# Patient Record
Sex: Male | Born: 1985 | Race: White | Hispanic: No | Smoking: Former smoker
Health system: Southern US, Community
[De-identification: ages and names within clinical notes are randomized; demographics above are authoritative.]

## PROBLEM LIST (undated history)

## (undated) DIAGNOSIS — S069X9A Unspecified intracranial injury with loss of consciousness of unspecified duration, initial encounter: Secondary | ICD-10-CM

## (undated) DIAGNOSIS — S069XAA Unspecified intracranial injury with loss of consciousness status unknown, initial encounter: Secondary | ICD-10-CM

## (undated) HISTORY — DX: Unspecified intracranial injury with loss of consciousness of unspecified duration, initial encounter: S06.9X9A

## (undated) HISTORY — DX: Unspecified intracranial injury with loss of consciousness status unknown, initial encounter: S06.9XAA

---

## 2014-07-28 ENCOUNTER — Ambulatory Visit (INDEPENDENT_AMBULATORY_CARE_PROVIDER_SITE_OTHER): Payer: BLUE CROSS/BLUE SHIELD | Admitting: Urgent Care

## 2014-07-28 VITALS — BP 120/72 | HR 80 | Temp 98.1°F | Resp 18 | Ht 72.0 in | Wt 163.0 lb

## 2014-07-28 DIAGNOSIS — W540XXA Bitten by dog, initial encounter: Principal | ICD-10-CM

## 2014-07-28 DIAGNOSIS — S61452A Open bite of left hand, initial encounter: Secondary | ICD-10-CM

## 2014-07-28 DIAGNOSIS — Z23 Encounter for immunization: Secondary | ICD-10-CM

## 2014-07-28 DIAGNOSIS — M19042 Primary osteoarthritis, left hand: Secondary | ICD-10-CM

## 2014-07-28 DIAGNOSIS — M13142 Monoarthritis, not elsewhere classified, left hand: Secondary | ICD-10-CM

## 2014-07-28 MED ORDER — NAPROXEN SODIUM 550 MG PO TABS: 550.0000 mg | ORAL_TABLET | Freq: Two times a day (BID) | ORAL | Status: DC

## 2014-07-28 MED ORDER — AMOXICILLIN-POT CLAVULANATE 875-125 MG PO TABS: 1.0000 | ORAL_TABLET | Freq: Two times a day (BID) | ORAL | Status: AC

## 2014-07-28 NOTE — Progress Notes (Signed)
    MRN: [id number] DOB: 04/11/1986  Subjective:   Tonna CornerJoshua Arnold is a 29 y.o. male presenting for 3 week history of dog bite to his left hand. Patient states that he has 2 dogs that got into a scuffle, he tried to separate them and suffered a bite to his left hand. The bite was quick, reports dog is vaccinated and has not had any similar/aggressive behavior before or since. Patient states that his wound bled immediately after his bite, states that he applied pressure, kept it clean. He also had swelling of his hand which has improved except for his 2nd finger and surrounding swelling near bite wounds, also admits some numbness of 2nd finger over radial side. Denies fevers, erythema, streaking, pus or drainage, warmth, weakness. Of note, patient is right handed. Denies any other aggravating or relieving factors, no other questions or concerns.  Zachary Arnold currently has no medications in their medication list.  He has No Known Allergies.  Zachary Arnold  has a past medical history of TBI (traumatic brain injury). Also  has no past surgical history on file.  ROS As in subjective.  Objective:   Vitals: BP 120/72 mmHg  Pulse 80  Temp(Src) 98.1 F (36.7 C) (Oral)  Resp 18  Ht 6' (1.829 m)  Wt 163 lb (73.936 kg)  BMI 22.10 kg/m2  SpO2 99%  Physical Exam  Constitutional: He is oriented to person, place, and time and well-developed, well-nourished, and in no distress.  Cardiovascular: Normal rate.   Pulmonary/Chest: Effort normal.  Musculoskeletal:       Left hand: He exhibits decreased range of motion (slightly decreased flexion of 2nd finger), tenderness (mild tenderness), laceration (bite wound as depicted over 2nd MCP joint and mid palm) and swelling (2nd finger). He exhibits no bony tenderness, normal capillary refill and no deformity. Normal sensation noted. Normal strength noted.       Hands: Neurological: He is alert and oriented to person, place, and time.  Skin: Skin is warm and dry. No rash  noted. No erythema.  Psychiatric: Mood and affect normal.   Assessment and Plan :   1. Dog bite, hand, left, initial encounter 2. Inflammation of hand joint, left - Start Augmentin x10 days, advised patient to watch for signs of worsening infection. Will refer to hand specialist if worsening/no resolution of symptoms. Otherwise due to bite occuring 3 weeks ago and current symptoms/physical exam findings, will follow up with him in 10 days.  3. Need for Tdap vaccination - Tdap vaccine greater than or equal to 7yo IM   Wallis BambergMario Tiffiney Sparrow, PA-C [facility] [phone] 07/28/2014 3:43 PM

## 2014-07-28 NOTE — Patient Instructions (Addendum)
- If you develop hand redness, pus, increased swelling and pain, fevers, changes in hand sensation or range of motion, please call and let me know immediately so we can refer you to a hand specialist. - Otherwise, we will see you in 10 days after completion of your antibiotic course.   Animal Bite An animal bite can result in a scratch on the skin, deep open cut, puncture of the skin, crush injury, or tearing away of the skin or a body part. Dogs are responsible for most animal bites. Children are bitten more often than adults. An animal bite can range from very mild to more serious. A small bite from your house pet is no cause for alarm. However, some animal bites can become infected or injure a bone or other tissue. You must seek medical care if:  The skin is broken and bleeding does not slow down or stop after 15 minutes.  The puncture is deep and difficult to clean (such as a cat bite).  Pain, warmth, redness, or pus develops around the wound.  The bite is from a stray animal or rodent. There may be a risk of rabies infection.  The bite is from a snake, raccoon, skunk, fox, coyote, or bat. There may be a risk of rabies infection.  The person bitten has a chronic illness such as diabetes, liver disease, or cancer, or the person takes medicine that lowers the immune system.  There is concern about the location and severity of the bite. It is important to clean and protect an animal bite wound right away to prevent infection. Follow these steps:  Clean the wound with plenty of water and soap.  Apply an antibiotic cream.  Apply gentle pressure over the wound with a clean towel or gauze to slow or stop bleeding.  Elevate the affected area above the heart to help stop any bleeding.  Seek medical care. Getting medical care within 8 hours of the animal bite leads to the best possible outcome. DIAGNOSIS  Your caregiver will most likely:  Take a detailed history of the animal and the bite  injury.  Perform a wound exam.  Take your medical history. Blood tests or X-rays may be performed. Sometimes, infected bite wounds are cultured and sent to a lab to identify the infectious bacteria.  TREATMENT  Medical treatment will depend on the location and type of animal bite as well as the patient's medical history. Treatment may include:  Wound care, such as cleaning and flushing the wound with saline solution, bandaging, and elevating the affected area.  Antibiotics.  Tetanus immunization.  Rabies immunization.  Leaving the wound open to heal. This is often done with animal bites, due to the high risk of infection. However, in certain cases, wound closure with stitches, wound adhesive, skin adhesive strips, or staples may be used. Infected bites that are left untreated may require intravenous (IV) antibiotics and surgical treatment in the hospital. HOME CARE INSTRUCTIONS  Follow your caregiver's instructions for wound care.  Take all medicines as directed.  If your caregiver prescribes antibiotics, take them as directed. Finish them even if you start to feel better.  Follow up with your caregiver for further exams or immunizations as directed. You may need a tetanus shot if:  You cannot remember when you had your last tetanus shot.  You have never had a tetanus shot.  The injury broke your skin. If you get a tetanus shot, your arm may swell, get red, and feel warm  to the touch. This is common and not a problem. If you need a tetanus shot and you choose not to have one, there is a rare chance of getting tetanus. Sickness from tetanus can be serious. SEEK MEDICAL CARE IF:  You notice warmth, redness, soreness, swelling, pus discharge, or a bad smell coming from the wound.  You have a red line on the skin coming from the wound.  You have a fever, chills, or a general ill feeling.  You have nausea or vomiting.  You have continued or worsening pain.  You have  trouble moving the injured part.  You have other questions or concerns. MAKE SURE YOU:  Understand these instructions.  Will watch your condition.  Will get help right away if you are not doing well or get worse. Document Released: 03/05/2011 Document Revised: 09/09/2011 Document Reviewed: 03/05/2011 [facility] Patient Information 2015 [place], [place]. This information is not intended to replace advice given to you by your health care provider. Make sure you discuss any questions you have with your health care provider.   Tdap Vaccine (Tetanus, Diphtheria, Pertussis): What You Need to Know 1. Why get vaccinated? Tetanus, diphtheria and pertussis can be very serious diseases, even for adolescents and adults. Tdap vaccine can protect [place] from these diseases. TETANUS (Lockjaw) causes painful muscle tightening and stiffness, usually all over the body.  It can lead to tightening of muscles in the head and neck so you can't open your mouth, swallow, or sometimes even breathe. Tetanus kills about 1 out of 5 people who are infected. DIPHTHERIA can cause a thick coating to form in the back of the throat.  It can lead to breathing problems, paralysis, heart failure, and death. PERTUSSIS (Whooping Cough) causes severe coughing spells, which can cause difficulty breathing, vomiting and disturbed sleep.  It can also lead to weight loss, incontinence, and rib fractures. Up to 2 in 100 adolescents and 5 in 100 adults with pertussis are hospitalized or have complications, which could include pneumonia or death. These diseases are caused by bacteria. Diphtheria and pertussis are spread from person to person through coughing or sneezing. Tetanus enters the body through cuts, scratches, or wounds. Before vaccines, the [place] saw as many as 200,000 cases a year of diphtheria and pertussis, and hundreds of cases of tetanus. Since vaccination began, tetanus and diphtheria have dropped by about 99% and  pertussis by about 80%. 2. Tdap vaccine Tdap vaccine can protect adolescents and adults from tetanus, diphtheria, and pertussis. One dose of Tdap is routinely given at age 44 or 66. People who did not get Tdap at that age should get it as soon as possible. Tdap is especially important for health care professionals and anyone having close contact with a baby younger than 12 months. Pregnant women should get a dose of Tdap during every pregnancy, to protect the newborn from pertussis. Infants are most at risk for severe, life-threatening complications from pertussis. A similar vaccine, called Td, protects from tetanus and diphtheria, but not pertussis. A Td booster should be given every 10 years. Tdap may be given as one of these boosters if you have not already gotten a dose. Tdap may also be given after a severe cut or burn to prevent tetanus infection. Your doctor can give you more information. Tdap may safely be given at the same time as other vaccines. 3. Some people should not get this vaccine  If you ever had a life-threatening allergic reaction after a dose of any  tetanus, diphtheria, or pertussis containing vaccine, OR if you have a severe allergy to any part of this vaccine, you should not get Tdap. Tell your doctor if you have any severe allergies.  If you had a coma, or long or multiple seizures within 7 days after a childhood dose of DTP or DTaP, you should not get Tdap, unless a cause other than the vaccine was found. You can still get Td.  Talk to your doctor if you:  have epilepsy or another nervous system problem,  had severe pain or swelling after any vaccine containing diphtheria, tetanus or pertussis,  ever had Guillain-Barr Syndrome (GBS),  aren't feeling well on the day the shot is scheduled. 4. Risks of a vaccine reaction With any medicine, including vaccines, there is a chance of side effects. These are usually mild and go away on their own, but serious reactions are  also possible. Brief fainting spells can follow a vaccination, leading to injuries from falling. Sitting or lying down for about 15 minutes can help prevent these. Tell your doctor if you feel dizzy or light-headed, or have vision changes or ringing in the ears. Mild problems following Tdap (Did not interfere with activities)  Pain where the shot was given (about 3 in 4 adolescents or 2 in 3 adults)  Redness or swelling where the shot was given (about 1 person in 5)  Mild fever of at least 100.47F (up to about 1 in 25 adolescents or 1 in 100 adults)  Headache (about 3 or 4 people in 10)  Tiredness (about 1 person in 3 or 4)  Nausea, vomiting, diarrhea, stomach ache (up to 1 in 4 adolescents or 1 in 10 adults)  Chills, body aches, sore joints, rash, swollen glands (uncommon) Moderate problems following Tdap (Interfered with activities, but did not require medical attention)  Pain where the shot was given (about 1 in 5 adolescents or 1 in 100 adults)  Redness or swelling where the shot was given (up to about 1 in 16 adolescents or 1 in 25 adults)  Fever over 102F (about 1 in 100 adolescents or 1 in 250 adults)  Headache (about 3 in 20 adolescents or 1 in 10 adults)  Nausea, vomiting, diarrhea, stomach ache (up to 1 or 3 people in 100)  Swelling of the entire arm where the shot was given (up to about 3 in 100). Severe problems following Tdap (Unable to perform usual activities; required medical attention)  Swelling, severe pain, bleeding and redness in the arm where the shot was given (rare). A severe allergic reaction could occur after any vaccine (estimated less than 1 in a million doses). 5. What if there is a serious reaction? What should I look for?  Look for anything that concerns you, such as signs of a severe allergic reaction, very high fever, or behavior changes. Signs of a severe allergic reaction can include hives, swelling of the face and throat, difficulty  breathing, a fast heartbeat, dizziness, and weakness. These would start a few minutes to a few hours after the vaccination. What should I do?  If you think it is a severe allergic reaction or other emergency that can't wait, call 9-1-1 or get the person to the nearest hospital. Otherwise, call your doctor.  Afterward, the reaction should be reported to the "Vaccine Adverse Event Reporting System" (VAERS). Your doctor might file this report, or you can do it yourself through the VAERS web site at www.vaers.LAgents.no, or by calling 1-(510)569-2602. VAERS is only  for reporting reactions. They do not give medical advice.  6. The National Vaccine Injury Compensation Program The [employer] Vaccine Injury Compensation Program (VICP) is a federal program that was created to compensate people who may have been injured by certain vaccines. Persons who believe they may have been injured by a vaccine can learn about the program and about filing a claim by calling 1-4014551516 or visiting the VICP website at SpiritualWord.at. 7. How can I learn more?  Ask your doctor.  Call your local or state health department.  Contact the Centers for Disease Control and Prevention (CDC):  Call 772 053 5457 or visit CDC's website at PicCapture.uy. CDC Tdap Vaccine VIS (11/07/11) Document Released: 12/17/2011 Document Revised: 11/01/2013 Document Reviewed: 09/29/2013 [employer] Patient Information 2015 [place], [place]. This information is not intended to replace advice given to you by your health care provider. Make sure you discuss any questions you have with your health care provider.

## 2014-08-16 ENCOUNTER — Ambulatory Visit (INDEPENDENT_AMBULATORY_CARE_PROVIDER_SITE_OTHER): Payer: BLUE CROSS/BLUE SHIELD | Admitting: Internal Medicine

## 2014-08-16 VITALS — BP 112/64 | HR 76 | Temp 97.7°F | Resp 16 | Ht 72.0 in | Wt 158.0 lb

## 2014-08-16 DIAGNOSIS — J069 Acute upper respiratory infection, unspecified: Secondary | ICD-10-CM

## 2014-08-16 MED ORDER — FLUTICASONE PROPIONATE 50 MCG/ACT NA SUSP: 2.0000 | Freq: Every day | NASAL | Status: DC

## 2014-08-16 NOTE — Patient Instructions (Signed)
It was nice to see you today.    You have a viral URI (see below).  I have prescribed Flonase for your congestion.    Get plenty of rest and drink plenty of fluids.  Take care  Dr. Adriana Simas Upper Respiratory Infection, Adult An upper respiratory infection (URI) is also sometimes known as the common cold. The upper respiratory tract includes the nose, sinuses, throat, trachea, and bronchi. Bronchi are the airways leading to the lungs. Most people improve within 1 week, but symptoms can last up to 2 weeks. A residual cough may last even longer.  CAUSES Many different viruses can infect the tissues lining the upper respiratory tract. The tissues become irritated and inflamed and often become very moist. Mucus production is also common. A cold is contagious. You can easily spread the virus to others by oral contact. This includes kissing, sharing a glass, coughing, or sneezing. Touching your mouth or nose and then touching a surface, which is then touched by another person, can also spread the virus. SYMPTOMS  Symptoms typically develop 1 to 3 days after you come in contact with a cold virus. Symptoms vary from person to person. They may include:  Runny nose.  Sneezing.  Nasal congestion.  Sinus irritation.  Sore throat.  Loss of voice (laryngitis).  Cough.  Fatigue.  Muscle aches.  Loss of appetite.  Headache.  Low-grade fever. DIAGNOSIS  You might diagnose your own cold based on familiar symptoms, since most people get a cold 2 to 3 times a year. Your caregiver can confirm this based on your exam. Most importantly, your caregiver can check that your symptoms are not due to another disease such as strep throat, sinusitis, pneumonia, asthma, or epiglottitis. Blood tests, throat tests, and X-rays are not necessary to diagnose a common cold, but they may sometimes be helpful in excluding other more serious diseases. Your caregiver will decide if any further tests are required. RISKS  AND COMPLICATIONS  You may be at risk for a more severe case of the common cold if you smoke cigarettes, have chronic heart disease (such as heart failure) or lung disease (such as asthma), or if you have a weakened immune system. The very young and very old are also at risk for more serious infections. Bacterial sinusitis, middle ear infections, and bacterial pneumonia can complicate the common cold. The common cold can worsen asthma and chronic obstructive pulmonary disease (COPD). Sometimes, these complications can require emergency medical care and may be life-threatening. PREVENTION  The best way to protect against getting a cold is to practice good hygiene. Avoid oral or hand contact with people with cold symptoms. Wash your hands often if contact occurs. There is no clear evidence that vitamin C, vitamin E, echinacea, or exercise reduces the chance of developing a cold. However, it is always recommended to get plenty of rest and practice good nutrition. TREATMENT  Treatment is directed at relieving symptoms. There is no cure. Antibiotics are not effective, because the infection is caused by a virus, not by bacteria. Treatment may include:  Increased fluid intake. Sports drinks offer valuable electrolytes, sugars, and fluids.  Breathing heated mist or steam (vaporizer or shower).  Eating chicken soup or other clear broths, and maintaining good nutrition.  Getting plenty of rest.  Using gargles or lozenges for comfort.  Controlling fevers with ibuprofen or acetaminophen as directed by your caregiver.  Increasing usage of your inhaler if you have asthma. Zinc gel and zinc lozenges, taken in the  first 24 hours of the common cold, can shorten the duration and lessen the severity of symptoms. Pain medicines may help with fever, muscle aches, and throat pain. A variety of non-prescription medicines are available to treat congestion and runny nose. Your caregiver can make recommendations and may  suggest nasal or lung inhalers for other symptoms.  HOME CARE INSTRUCTIONS   Only take over-the-counter or prescription medicines for pain, discomfort, or fever as directed by your caregiver.  Use a warm mist humidifier or inhale steam from a shower to increase air moisture. This may keep secretions moist and make it easier to breathe.  Drink enough water and fluids to keep your urine clear or pale yellow.  Rest as needed.  Return to work when your temperature has returned to normal or as your caregiver advises. You may need to stay home longer to avoid infecting others. You can also use a face mask and careful hand washing to prevent spread of the virus. SEEK MEDICAL CARE IF:   After the first few days, you feel you are getting worse rather than better.  You need your caregiver's advice about medicines to control symptoms.  You develop chills, worsening shortness of breath, or brown or red sputum. These may be signs of pneumonia.  You develop yellow or brown nasal discharge or pain in the face, especially when you bend forward. These may be signs of sinusitis.  You develop a fever, swollen neck glands, pain with swallowing, or white areas in the back of your throat. These may be signs of strep throat. SEEK IMMEDIATE MEDICAL CARE IF:   You have a fever.  You develop severe or persistent headache, ear pain, sinus pain, or chest pain.  You develop wheezing, a prolonged cough, cough up blood, or have a change in your usual mucus (if you have chronic lung disease).  You develop sore muscles or a stiff neck. Document Released: 12/11/2000 Document Revised: 09/09/2011 Document Reviewed: 09/22/2013 [facility] Patient Information 2015 [place], [place]. This information is not intended to replace advice given to you by your health care provider. Make sure you discuss any questions you have with your health care provider.

## 2014-08-16 NOTE — Progress Notes (Addendum)
   Subjective:    [name], male    DOB: Mar 25, 1986, 29 y.o.   MRN: [id number]  HPI 29 year old male presents today with complaints of nasal congestion & cough.  1) Nasal congestion/cough  Patient reports that he has been experiencing significant nasal congestion, drainage and cough for the past 5 days.  No exacerbating/relieving factors.  No interventions/treatments tried.   No associated sore throat, fever, chills.  No sick contacts.  He does note "popping" of his left ear.   Review of Systems  Constitutional: Negative for fever and appetite change.  HENT: Positive for congestion, postnasal drip, rhinorrhea and sinus pressure. Negative for sore throat.   Respiratory: Negative.   Cardiovascular: Negative.   Gastrointestinal: Negative.   Musculoskeletal: Negative for myalgias.  Skin: Negative.       Objective:   Physical Exam Filed Vitals:   08/16/14 1103  BP: 112/64  Pulse: 76  Temp: 97.7 F (36.5 C)  Resp: 16   Exam: General: well appearing, NAD. HEENT: NCAT.  TM's normal bilaterally. Oropharynx mildly erythematous. No exudates noted.  Mild L maxillary sinus tenderness.  Cardiovascular: RRR. No murmurs, rubs, or gallops. Respiratory: CTAB. No rales, rhonchi, or wheeze. Abdomen: soft, nontender, nondistended. Extremities: No LE edema.    Assessment & Plan:   URI - Symptoms and physical exam consistent with URI. - Advised symptomatic care. - Flonase for congestion.  Everlene OtherJayce Addy Mcmannis DO Family Medicine PGY-3 I have participated in the care of this patient with the Resident MD and agree with Diagnosis and Plan as documented. Robert P. Merla Richesoolittle, M.D.

## 2014-08-19 NOTE — Addendum Note (Signed)
Addended by: Tonye PearsonOLITTLE, Jovaughn Wojtaszek P on: 08/19/2014 01:21 PM   Modules accepted: Level of Service

## 2015-04-04 ENCOUNTER — Ambulatory Visit: Payer: Worker's Compensation

## 2015-04-04 ENCOUNTER — Ambulatory Visit (INDEPENDENT_AMBULATORY_CARE_PROVIDER_SITE_OTHER): Payer: Worker's Compensation | Admitting: Family Medicine

## 2015-04-04 VITALS — BP 130/78 | HR 54 | Temp 98.2°F | Resp 16 | Ht 72.0 in | Wt 179.0 lb

## 2015-04-04 DIAGNOSIS — S60132A Contusion of left middle finger with damage to nail, initial encounter: Secondary | ICD-10-CM

## 2015-04-04 DIAGNOSIS — Y990 Civilian activity done for income or pay: Secondary | ICD-10-CM

## 2015-04-04 NOTE — Patient Instructions (Signed)
Wear the finger protector until it is not extremely tender. Return if problems or concerns.

## 2015-04-04 NOTE — Progress Notes (Signed)
[name], male    DOB: September 30, 1985  Age: 29 y.o. MRN: [id number]  Chief Complaint  Patient presents with  . 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 A dog jumped away from him while he was holding the leash yesterday. A knot on the leash somehow caught the distal portion of the left third finger. It has been painful and swollen a little and developed some erythema. He came in to get it checked to make sure nothing was broken.  Current allergies, medications, problem list, past/family and social histories reviewed.  Objective:  BP 130/78 mmHg  Pulse 54  Temp(Src) 98.2 F (36.8 C)  Resp 16  Ht 6' (1.829 m)  Wt 179 lb (81.194 kg)  BMI 24.27 kg/m2  SpO2 98%  No broken skin. He has a little little band of subungual hematoma under the cuticle inch. There is erythema of the dorsum of the third finger down to the DLP joint. There is bruising on the pad of the finger. Flexion and extension are intact though very painful. The pain is from the distal portion of the middle phalanx and beyond.  [facility] reading (PRIMARY) by  Dr. Alwyn Ren Normal x-ray.    Assessment & Plan:   Assessment: 1. Contusion of left middle finger with damage to nail, initial encounter   2. Work related injury       Plan: Treat symptomatically  Orders Placed This Encounter  Procedures  . DG Finger Middle Left    Order Specific Question:  Reason for Exam (SYMPTOM  OR DIAGNOSIS REQUIRED)    Answer:  injury dip and distal phalynx of 3rd left finger    Order Specific Question:  Preferred imaging location?    Answer:  External    Patient Instructions  Wear the finger protector until it is not extremely tender. Return if problems or concerns.   Return if symptoms worsen or fail to improve.   Susa Bones, MD 04/04/2015

## 2015-08-23 ENCOUNTER — Ambulatory Visit (INDEPENDENT_AMBULATORY_CARE_PROVIDER_SITE_OTHER): Payer: BLUE CROSS/BLUE SHIELD | Admitting: Emergency Medicine

## 2015-08-23 VITALS — BP 114/70 | HR 87 | Temp 98.8°F | Resp 17 | Ht 73.0 in | Wt 170.0 lb

## 2015-08-23 DIAGNOSIS — J101 Influenza due to other identified influenza virus with other respiratory manifestations: Secondary | ICD-10-CM | POA: Diagnosis not present

## 2015-08-23 DIAGNOSIS — J029 Acute pharyngitis, unspecified: Secondary | ICD-10-CM | POA: Diagnosis not present

## 2015-08-23 DIAGNOSIS — R509 Fever, unspecified: Secondary | ICD-10-CM

## 2015-08-23 LAB — POCT INFLUENZA A/B
Influenza A, POC: POSITIVE — AB
Influenza B, POC: NEGATIVE

## 2015-08-23 LAB — POCT RAPID STREP A (OFFICE): Rapid Strep A Screen: NEGATIVE

## 2015-08-23 MED ORDER — OSELTAMIVIR PHOSPHATE 75 MG PO CAPS: 75.0000 mg | ORAL_CAPSULE | Freq: Two times a day (BID) | ORAL | Status: DC

## 2015-08-23 NOTE — Progress Notes (Signed)
By signing my name below, I, Stann Ore, attest that this documentation has been prepared under the direction and in the presence of Lesle Chris, MD. Electronically Signed: Stann Ore, Scribe. 08/23/2015 , 9:21 AM .  Patient was seen in room 7 .  Chief Complaint:  Chief Complaint  Patient presents with  . Chills  . Generalized Body Aches  . Night Sweats  . Sore Throat  . Cough    HPI: Zachary Arnold is a 30 y.o. male who reports to [facility] today complaining of flu-like symptoms that was noticed yesterday.  Pt states that his symptoms started with chills yesterday 12:00PM. Then, he noticed myalgia all over his body mid-afternoon. He felt really cold with chills last night and fell asleep with blankets. He felt hot throughout the night and woke up with sweats this morning. He has a mild headache with cough and congestion. He denies any sore throat. He denies flu shot this year.   He went on a trip to [place] and had a friend that was sick. She's gotten worse since the past weekend.   He works as a Engineer, site.   Past Medical History  Diagnosis Date  . TBI (traumatic brain injury) (HCC)    No past surgical history on file. Social History   Social History  . Marital Status: Legally Separated    Spouse Name: N/A  . Number of Children: N/A  . Years of Education: N/A   Social History Main Topics  . Smoking status: Former Games developer  . Smokeless tobacco: None  . Alcohol Use: 0.6 - 1.2 oz/week    1-2 Standard drinks or equivalent per week  . Drug Use: No  . Sexual Activity: Not Asked   Other Topics Concern  . None   Social History Narrative   Family History  Problem Relation Age of Onset  . Cancer Mother   . Cancer Maternal Grandmother   . Cancer Maternal Grandfather    No Known Allergies Prior to Admission medications   Not on File     ROS:  Constitutional: negative for weight changes, or fatigue; positive for chills, sweats,  fever HEENT: negative for vision changes, hearing loss, rhinorrhea, ST, epistaxis, or sinus pressure; positive for congestion Cardiovascular: negative for chest pain or palpitations Respiratory: negative for hemoptysis, wheezing, shortness of breath; positive for cough Abdominal: negative for abdominal pain, nausea, vomiting, diarrhea, or constipation Dermatological: negative for rash Musc: positive for myalgia (general) Neurologic: negative for dizziness, or syncope; positive for headache All other systems reviewed and are otherwise negative with the exception to those above and in the HPI.  PHYSICAL EXAM: Filed Vitals:   08/23/15 0854  BP: 114/70  Pulse: 87  Temp: 98.8 F (37.1 C)  Resp: 17   Body mass index is 22.43 kg/(m^2).   General: Alert, no acute distress HEENT:  Normocephalic, atraumatic, oropharynx patent; Significant nasal congestion, mild redness in throat Eye: EOMI, PEERLDC Cardiovascular:  Regular rate and rhythm, no rubs murmurs or gallops.  No Carotid bruits, radial pulse intact. No pedal edema.  Respiratory: Clear to auscultation bilaterally.  No wheezes, rales, or rhonchi.  No cyanosis, no use of accessory musculature Abdominal: No organomegaly, abdomen is soft and non-tender, positive bowel sounds. No masses. Musculoskeletal: Gait intact. No edema, tenderness Skin: No rashes. Neurologic: Facial musculature symmetric. Psychiatric: Patient acts appropriately throughout our interaction.  Lymphatic: No cervical or submandibular lymphadenopathy Genitourinary/Anorectal: No acute findings  LABS: Results for orders placed or performed  in visit on 08/23/15  POCT Influenza A/B  Result Value Ref Range   Influenza A, POC Positive (A) Negative   Influenza B, POC Negative Negative  POCT rapid strep A  Result Value Ref Range   Rapid Strep A Screen Negative Negative    EKG/XRAY:   Primary read interpreted by Dr. Cleta Alberts at [facility].   ASSESSMENT/PLAN: Patient with  fluid. Will treat with Tamiflu twice a day 5 days.I personally performed the services described in this documentation, which was scribed in my presence. The recorded information has been reviewed and is accurate.   Gross sideeffects, risk and benefits, and alternatives of medications d/w patient. Patient is aware that all medications have potential sideeffects and we are unable to predict every sideeffect or drug-drug interaction that may occur.  Lesle Chris MD 08/23/2015 9:21 AM

## 2015-08-23 NOTE — Patient Instructions (Signed)

## 2015-10-04 ENCOUNTER — Ambulatory Visit (INDEPENDENT_AMBULATORY_CARE_PROVIDER_SITE_OTHER): Payer: Worker's Compensation | Admitting: Family Medicine

## 2015-10-04 VITALS — BP 122/66 | HR 81 | Temp 97.7°F | Resp 16 | Ht 73.0 in | Wt 174.4 lb

## 2015-10-04 DIAGNOSIS — W5581XA Bitten by other mammals, initial encounter: Secondary | ICD-10-CM | POA: Diagnosis not present

## 2015-10-04 DIAGNOSIS — T148 Other injury of unspecified body region: Secondary | ICD-10-CM | POA: Diagnosis not present

## 2015-10-04 DIAGNOSIS — T148XXA Other injury of unspecified body region, initial encounter: Secondary | ICD-10-CM

## 2015-10-04 MED ORDER — MUPIROCIN 2 % EX OINT: 1.0000 "application " | TOPICAL_OINTMENT | Freq: Two times a day (BID) | CUTANEOUS | Status: DC

## 2015-10-04 NOTE — Progress Notes (Signed)
     HPI  Patient presents today for a dog bite for worker's comp  Patient explains about 830 am he was being introduced to a dog for a client (works for [employer]) when the dog lost control suddenly and bit him on the L abd, L hip, and L hand.   The dog owner is [place] into the [place] The dog is up to date on all immunizations  There are no deep wounds, they are descibed as abrasions, he feels he can go back to work today   PMH: Smoking status noted ROS: Per HPI  Objective: BP 122/66 mmHg  Pulse 81  Temp(Src) 97.7 F (36.5 C) (Oral)  Resp 16  Ht 6\' 1"  (1.854 m)  Wt 174 lb 6.4 oz (79.107 kg)  BMI 23.01 kg/m2  SpO2 98% Gen: NAD, alert, cooperative with exam HEENT: NCAT Neuro: Alert and oriented, No gross deficits Skin: L hand with small red lsesion without any broken skin on fingerweb between thumb and 2nd digit  L abd with abrasion measuring 10.8 cm X 4.5 cm, 4 separate circular abrasions approix 0.5 cm in diameter consistent with K9 teeth marks, no puncture or bleeding present  L anterior hip/inguinal area with 2 circular abrasions approx 0.5 cm in diameter without bleeding or puncture  Assessment and plan:  # Animal bite All abrasions, no deep wounds No deep puncture wounds, no bleeding No warmth or induration of any lesions Cover with mupirocin hear today,  Mupirociun prescribed to use for 5 days No deep wound sto warrant systemic ABx or concern for tetanus    Meds ordered this encounter  Medications  . mupirocin ointment (BACTROBAN) 2 %    Sig: Place 1 application into the nose 2 (two) times daily.    Dispense:  22 g    Refill:  0    Kevin FentonSamuel Hermenia Fritcher, MD 10:36 AM

## 2015-10-04 NOTE — Patient Instructions (Addendum)
Great to meet you!  If you have any issues please come back right away. Apply mupirocin ointment twice daily for 5 days to each wound.   Come back right away if you develop worsening pain, swelling, redness of the wound, fever, or begin to feel ill.

## 2015-11-20 ENCOUNTER — Ambulatory Visit (INDEPENDENT_AMBULATORY_CARE_PROVIDER_SITE_OTHER): Payer: BLUE CROSS/BLUE SHIELD | Admitting: Family Medicine

## 2015-11-20 ENCOUNTER — Ambulatory Visit (INDEPENDENT_AMBULATORY_CARE_PROVIDER_SITE_OTHER): Payer: BLUE CROSS/BLUE SHIELD

## 2015-11-20 VITALS — BP 126/78 | HR 82 | Temp 98.9°F | Resp 17 | Ht 72.5 in | Wt 167.0 lb

## 2015-11-20 DIAGNOSIS — M25561 Pain in right knee: Secondary | ICD-10-CM

## 2015-11-20 NOTE — Progress Notes (Signed)
Subjective:  By signing my name below, I, Zachary Arnold, attest that this documentation has been prepared under the direction and in the presence of Meredith Staggers, MD.  Electronically Signed: Andrew Au, ED Scribe. 11/20/2015. 8:46 AM.  [name], male    DOB: 01-Jun-1986, 30 y.o.   MRN: [id number]  HPI   Chief Complaint  Patient presents with  . Knee Pain    right side    HPI Comments: Zachary Arnold is a 30 y.o. male who presents to the [facility] complaining of a recurrent right knee injury that occurred 3 days ago and 2 days ago . Pt stats while crouched down he twisted right and heard a "pop". He notes swelling to his knee 2-3 days ago that has somewhat improved. He reports worsening soreness with bearing weight to right knee  but denies locking and giving way of right knee. Pt reports first episode of knee pain occurred about a year ago while doing yoga that improved with some rest. Knee pain has become more frequent throughout the year worse the past 3 days. Pt is Veteran and has been to [place] twice and notes some wear and tear to knees but no known knee injury.   Pt works at [employer].   There are no active problems to display for this patient.  Past Medical History  Diagnosis Date  . TBI (traumatic brain injury) (HCC)    No past surgical history on file. No Known Allergies Prior to Admission medications   Not on File   Social History   Social History  . Marital Status: Legally Separated    Spouse Name: N/A  . Number of Children: N/A  . Years of Education: N/A   Occupational History  . Not on file.   Social History Main Topics  . Smoking status: Former Games developer  . Smokeless tobacco: Not on file  . Alcohol Use: 0.6 - 1.2 oz/week    1-2 Standard drinks or equivalent per week  . Drug Use: No  . Sexual Activity: Not on file   Other Topics Concern  . Not on file   Social History Narrative   Review of Systems    Musculoskeletal: Positive for arthralgias and gait problem.  Skin: Negative for color change, rash and wound.  Neurological: Negative for weakness and numbness.   Objective:  Physical Exam  Constitutional: He is oriented to person, place, and time. He appears well-developed and well-nourished. No distress.  HENT:  Head: Normocephalic and atraumatic.  Eyes: Conjunctivae and EOM are normal.  Neck: Neck supple.  Cardiovascular: Normal rate.   Pulmonary/Chest: Effort normal.  Musculoskeletal: Normal range of motion.  Trace effusion on right knee. Skin intact no erythema. No warmth. Slightly tender to lateral joint line. Fibular head non tender. Full ROM but pain at terminal flexion. Negative valgus and varus test. Pain  with McMurray's and external rotation.  On lochman's more motion compared to left but do feel an endpoint.   Neurological: He is alert and oriented to person, place, and time.  Skin: Skin is warm and dry.  Psychiatric: He has a normal mood and affect. His behavior is normal.  Nursing note and vitals reviewed.  Filed Vitals:   11/20/15 0839  BP: 126/78  Pulse: 82  Temp: 98.9 F (37.2 C)  TempSrc: Oral  Resp: 17  Height: 6' 0.5" (1.842 m)  Weight: 167 lb (75.751 kg)  SpO2: 98%    Dg Knee Complete 4  Views Right  11/20/2015  CLINICAL DATA:  Recurrent knee injury.  Swelling. EXAM: RIGHT KNEE - COMPLETE 4+ VIEW COMPARISON:  None. FINDINGS: No acute bony or joint abnormality identified. No evidence of fracture or dislocation . IMPRESSION: No acute or focal abnormality. Electronically Signed   By: Maisie Fus  Register   On: 11/20/2015 09:35    Assessment & Plan:   Zachary Arnold is a 30 y.o. male Knee pain, right - Plan: DG Knee Complete 4 Views Right, Apply knee sleeve  -Episodic pain with slight instability symptoms. No true locking or giving way. Minimal effusion. Differential diagnosis includes Arnold meniscus tear, less likely ligamentous tear.   -Try hinged knee brace,  over-the-counter NSAID as needed, home exercise program for next 1-2 weeks at the most. If not significantly improved by that time, consider MRI or orthopedic eval.    No orders of the defined types were placed in this encounter.   Patient Instructions       IF you received an x-ray today, you will receive an invoice from [facility]. Please contact [facility] at [phone] with questions or concerns regarding your invoice.   IF you received labwork today, you will receive an invoice from [employer]. Please contact [facility] at [phone] with questions or concerns regarding your invoice.   Our billing staff will not be able to assist you with questions regarding bills from these companies.  You will be contacted with the lab results as soon as they are available. The fastest way to get your results is to activate your My Chart account. Instructions are located on the last page of this paperwork. If you have not heard from [place] regarding the results in 2 weeks, please contact this office.     You can try the knee brace for now, see information on exercises below in case this is a meniscus tear. Over-the-counter Advil or Aleve is okay for now. Over the next 1-2 weeks, if not improving, let me know and I will order an MRI or refer you to an orthopedic surgeon. Return sooner if worse or trouble with weightbearing.  Meniscus Tear With Phase I Rehab The meniscus is a C-shaped cartilage structure, located in the knee joint between the thigh bone (femur) and the shinbone (tibia). Two menisci are located in each knee joint: the inner and outer meniscus. The meniscus acts as an adapter between the thigh bone and shinbone, allowing them to fit properly together. It also functions as a shock absorber, to reduce the stress placed on the knee joint and to help supply nutrients to the knee joint cartilage. As people age, the meniscus begins to harden and  become more vulnerable to injury. Meniscus tears are a common injury, especially in older athletes. Inner meniscus tears are more common than outer meniscus tears.  SYMPTOMS   Pain in the knee, especially with standing or squatting with the affected leg.  Tenderness along the joint line.  Swelling in the knee joint (effusion), usually starting 1 to 2 days after injury.  Locking or catching of the knee joint, causing inability to straighten the knee completely.  Giving way or buckling of the knee. CAUSES  A meniscus tear occurs when a force is placed on the meniscus that is greater than it can handle. Common causes of injury include:  Direct hit (trauma) to the knee.  Twisting, pivoting, or cutting (rapidly changing direction while running), kneeling or squatting.  Without injury, due to aging. RISK INCREASES WITH:  Contact sports (football, rugby).  Sports in which cleats are used with pivoting (soccer, lacrosse) or sports in which good shoe grip and sudden change in direction are required (racquetball, basketball, squash).  Previous knee injury.  Associated knee injury, particularly ligament injuries.  Poor strength and flexibility. PREVENTION  Warm up and stretch properly before activity.  Maintain physical fitness:  Strength, flexibility, and endurance.  Cardiovascular fitness.  Protect the knee with a brace or elastic bandage.  Wear properly fitted protective equipment (proper cleats for the surface). PROGNOSIS  Sometimes, meniscus tears heal on their own. However, definitive treatment requires surgery, followed by at least 6 weeks of recovery.  RELATED COMPLICATIONS   Recurring symptoms that result in a chronic problem.  Repeated knee injury, especially if sports are resumed too soon after injury or surgery.  Progression of the tear (the tear gets larger), if untreated.  Arthritis of the knee in later years (with or without surgery).  Complications of  surgery, including infection, bleeding, injury to nerves (numbness, weakness, paralysis) continued pain, giving way, locking, nonhealing of meniscus (if repaired), need for further surgery, and knee stiffness (loss of motion). TREATMENT  Treatment first involves the use of ice and medicine, to reduce pain and inflammation. You may find using crutches to walk more comfortable. However, it is okay to bear weight on the injured knee, if the pain will allow it. Surgery is often advised as a definitive treatment. Surgery is performed through an incision near the joint (arthroscopically). The torn piece of the meniscus is removed, and if possible the joint cartilage is repaired. After surgery, the joint must be restrained. After restraint, it is important to perform strengthening and stretching exercises to help regain strength and a full range of motion. These exercises may be completed at home or with a therapist.  MEDICATION  If pain medicine is needed, nonsteroidal anti-inflammatory medicines (aspirin and ibuprofen), or other minor pain relievers (acetaminophen), are often advised.  Do not take pain medicine for 7 days before surgery.  Prescription pain relievers may be given, if your caregiver thinks they are needed. Use only as directed and only as much as you need. HEAT AND COLD  Cold treatment (icing) should be applied for 10 to 15 minutes every 2 to 3 hours for inflammation and pain, and immediately after activity that aggravates your symptoms. Use ice packs or an ice massage.  Heat treatment may be used before performing stretching and strengthening activities prescribed by your caregiver, physical therapist, or athletic trainer. Use a heat pack or a warm water soak. SEEK MEDICAL CARE IF:   Symptoms get worse or do not improve in 2 weeks, despite treatment.  New, unexplained symptoms develop. (Drugs used in treatment may produce side effects.) EXERCISES RANGE OF MOTION (ROM) AND STRETCHING  EXERCISES - Meniscus Tear, Non-operative, Phase I These are some of the initial exercises with which you may start your rehabilitation program, until you see your caregiver again or until your symptoms are resolved. Remember:   These initial exercises are intended to be gentle. They will help you restore motion without increasing any swelling.  Completing these exercises allows less painful movement and prepares you for the more aggressive strengthening exercises in Phase II.  An effective stretch should be held for at least 30 seconds.  A stretch should never be painful. You should only feel a gentle lengthening or release in the stretched tissue. RANGE OF MOTION - Knee Flexion, Active  Lie on your back with both  knees straight. (If this causes back discomfort, bend your healthy knee, placing your foot flat on the floor.)  Slowly slide your heel back toward your buttocks until you feel a gentle stretch in the front of your knee or thigh.  Hold for __________ seconds. Slowly slide your heel back to the starting position. Repeat __________ times. Complete this exercise __________ times per day.  RANGE OF MOTION - Knee Flexion and Extension, Active-Assisted  Sit on the edge of a table or chair with your thighs firmly supported. It may be helpful to place a folded towel under the end of your right / left thigh.  Flexion (bending): Place the ankle of your healthy leg on top of the other ankle. Use your healthy leg to gently bend your right / left knee until you feel a mild tension across the top of your knee.  Hold for __________ seconds.  Extension (straightening): Switch your ankles so your right / left leg is on top. Use your healthy leg to straighten your right / left knee until you feel a mild tension on the backside of your knee.  Hold for __________ seconds. Repeat __________ times. Complete __________ times per day. STRETCH - Knee Flexion, Supine  Lie on the floor with your right  / left heel and foot lightly touching the wall. (Place both feet on the wall if you do not use a door frame.)  Without using any effort, allow gravity to slide your foot down the wall slowly until you feel a gentle stretch in the front of your right / left knee.  Hold this stretch for __________ seconds. Then return the leg to the starting position, using your healthy leg for help, if needed. Repeat __________ times. Complete this stretch __________ times per day.  STRETCH - Knee Extension Sitting  Sit with your right / left leg/heel propped on another chair, coffee table, or foot stool.  Allow your leg muscles to relax, letting gravity straighten out your knee.*  You should feel a stretch behind your right / left knee. Hold this position for __________ seconds. Repeat __________ times. Complete this stretch __________ times per day.  *Your physician, physical therapist or athletic trainer may instruct you place a __________ weight on your thigh, just above your kneecap, to deepen the stretch.  STRENGTHENING EXERCISES - Meniscus Tear, Non-operative, Phase I These exercises may help you when beginning to rehabilitate your injury. They may resolve your symptoms with or without further involvement from your physician, physical therapist or athletic trainer. While completing these exercises, remember:   Muscles can gain both the endurance and the strength needed for everyday activities through controlled exercises.  Complete these exercises as instructed by your physician, physical therapist or athletic trainer. Progress the resistance and repetitions only as guided. STRENGTH - Quadriceps, Isometrics  Lie on your back with your right / left leg extended and your opposite knee bent.  Gradually tense the muscles in the front of your right / left thigh. You should see either your knee cap slide up toward your hip or increased dimpling just above the knee. This motion will push the back of the knee  down toward the floor, mat, or bed on which you are lying.  Hold the muscle as tight as you can, without increasing your pain, for __________ seconds.  Relax the muscles slowly and completely between each repetition. Repeat __________ times. Complete this exercise __________ times per day.  STRENGTH - Quadriceps, Short Arcs   Lie on your  back. Place a __________ inch towel roll under your right / left knee, so that the knee bends slightly.  Raise only your lower leg by tightening the muscles in the front of your thigh. Do not allow your thigh to rise.  Hold this position for __________ seconds. Repeat __________ times. Complete this exercise __________ times per day.  OPTIONAL ANKLE WEIGHTS: Begin with ____________________, but DO NOT exceed ____________________. Increase in 1 pound/0.5 kilogram increments. STRENGTH - Quadriceps, Straight Leg Raises  Quality counts! Watch for signs that the quadriceps muscle is working, to be sure you are strengthening the correct muscles and not "cheating" by substituting with healthier muscles.  Lay on your back with your right / left leg extended and your opposite knee bent.  Tense the muscles in the front of your right / left thigh. You should see either your knee cap slide up or increased dimpling just above the knee. Your thigh may even shake a bit.  Tighten these muscles even more and raise your leg 4 to 6 inches off the floor. Hold for __________ seconds.  Keeping these muscles tense, lower your leg.  Relax the muscles slowly and completely in between each repetition. Repeat __________ times. Complete this exercise __________ times per day.  STRENGTH - Hamstring, Curls   Lay on your stomach with your legs extended. (If you lay on a bed, your feet may hang over the edge.)  Tighten the muscles in the back of your thigh to bend your right / left knee up to 90 degrees. Keep your hips flat on the bed.  Hold this position for __________  seconds.  Slowly lower your leg back to the starting position. Repeat __________ times. Complete this exercise __________ times per day.  STRENGTH - Quadriceps, Squats  Stand in a door frame so that your feet and knees are in line with the frame.  Use your hands for balance, not support, on the frame.  Slowly lower your weight, bending at the hips and knees. Keep your lower legs upright so that they are parallel with the door frame. Squat only within the range that does not increase your knee pain. Never let your hips drop below your knees.  Slowly return upright, pushing with your legs, not pulling with your hands. Repeat __________ times. Complete this exercise __________ times per day.  STRENGTH - Quad/VMO, Isometric   Sit in a chair with your right / left knee slightly bent. With your fingertips, feel the VMO muscle just above the inside of your knee. The VMO is important in controlling the position of your kneecap.  Keeping your fingertips on this muscle. Without actually moving your leg, attempt to drive your knee down as if straightening your leg. You should feel your VMO tense. If you have a difficult time, you may wish to try the same exercise on your healthy knee first.  Tense this muscle as hard as you can without increasing any knee pain.  Hold for __________ seconds. Relax the muscles slowly and completely in between each repetition. Repeat __________ times. Complete exercise __________ times per day.    This information is not intended to replace advice given to you by your health care provider. Make sure you discuss any questions you have with your health care provider.   Document Released: 07/01/1998 Document Revised: 11/01/2014 Document Reviewed: 09/29/2008 Elsevier Interactive Patient Education [employer]     I personally performed the services described in this documentation, which was scribed in my presence.  The recorded information has been reviewed and  considered, and addended by me as needed.

## 2015-11-20 NOTE — Patient Instructions (Addendum)
IF you received an x-ray today, you will receive an invoice from [facility]. Please contact [facility] at [phone] with questions or concerns regarding your invoice.   IF you received labwork today, you will receive an invoice from [employer]. Please contact [facility] at [phone] with questions or concerns regarding your invoice.   Our billing staff will not be able to assist you with questions regarding bills from these companies.  You will be contacted with the lab results as soon as they are available. The fastest way to get your results is to activate your My Chart account. Instructions are located on the last page of this paperwork. If you have not heard from [place] regarding the results in 2 weeks, please contact this office.     You can try the knee brace for now, see information on exercises below in case this is a meniscus tear. Over-the-counter Advil or Aleve is okay for now. Over the next 1-2 weeks, if not improving, let me know and I will order an MRI or refer you to an orthopedic surgeon. Return sooner if worse or trouble with weightbearing.  Meniscus Tear With Phase I Rehab The meniscus is a C-shaped cartilage structure, located in the knee joint between the thigh bone (femur) and the shinbone (tibia). Two menisci are located in each knee joint: the inner and outer meniscus. The meniscus acts as an adapter between the thigh bone and shinbone, allowing them to fit properly together. It also functions as a shock absorber, to reduce the stress placed on the knee joint and to help supply nutrients to the knee joint cartilage. As people age, the meniscus begins to harden and become more vulnerable to injury. Meniscus tears are a common injury, especially in older athletes. Inner meniscus tears are more common than outer meniscus tears.  SYMPTOMS   Pain in the knee, especially with standing or squatting with the affected  leg.  Tenderness along the joint line.  Swelling in the knee joint (effusion), usually starting 1 to 2 days after injury.  Locking or catching of the knee joint, causing inability to straighten the knee completely.  Giving way or buckling of the knee. CAUSES  A meniscus tear occurs when a force is placed on the meniscus that is greater than it can handle. Common causes of injury include:  Direct hit (trauma) to the knee.  Twisting, pivoting, or cutting (rapidly changing direction while running), kneeling or squatting.  Without injury, due to aging. RISK INCREASES WITH:  Contact sports (football, rugby).  Sports in which cleats are used with pivoting (soccer, lacrosse) or sports in which good shoe grip and sudden change in direction are required (racquetball, basketball, squash).  Previous knee injury.  Associated knee injury, particularly ligament injuries.  Poor strength and flexibility. PREVENTION  Warm up and stretch properly before activity.  Maintain physical fitness:  Strength, flexibility, and endurance.  Cardiovascular fitness.  Protect the knee with a brace or elastic bandage.  Wear properly fitted protective equipment (proper cleats for the surface). PROGNOSIS  Sometimes, meniscus tears heal on their own. However, definitive treatment requires surgery, followed by at least 6 weeks of recovery.  RELATED COMPLICATIONS   Recurring symptoms that result in a chronic problem.  Repeated knee injury, especially if sports are resumed too soon after injury or surgery.  Progression of the tear (the tear gets larger), if untreated.  Arthritis of the knee in later years (with or without surgery).  Complications  of surgery, including infection, bleeding, injury to nerves (numbness, weakness, paralysis) continued pain, giving way, locking, nonhealing of meniscus (if repaired), need for further surgery, and knee stiffness (loss of motion). TREATMENT  Treatment first  involves the use of ice and medicine, to reduce pain and inflammation. You may find using crutches to walk more comfortable. However, it is okay to bear weight on the injured knee, if the pain will allow it. Surgery is often advised as a definitive treatment. Surgery is performed through an incision near the joint (arthroscopically). The torn piece of the meniscus is removed, and if possible the joint cartilage is repaired. After surgery, the joint must be restrained. After restraint, it is important to perform strengthening and stretching exercises to help regain strength and a full range of motion. These exercises may be completed at home or with a therapist.  MEDICATION  If pain medicine is needed, nonsteroidal anti-inflammatory medicines (aspirin and ibuprofen), or other minor pain relievers (acetaminophen), are often advised.  Do not take pain medicine for 7 days before surgery.  Prescription pain relievers may be given, if your caregiver thinks they are needed. Use only as directed and only as much as you need. HEAT AND COLD  Cold treatment (icing) should be applied for 10 to 15 minutes every 2 to 3 hours for inflammation and pain, and immediately after activity that aggravates your symptoms. Use ice packs or an ice massage.  Heat treatment may be used before performing stretching and strengthening activities prescribed by your caregiver, physical therapist, or athletic trainer. Use a heat pack or a warm water soak. SEEK MEDICAL CARE IF:   Symptoms get worse or do not improve in 2 weeks, despite treatment.  New, unexplained symptoms develop. (Drugs used in treatment may produce side effects.) EXERCISES RANGE OF MOTION (ROM) AND STRETCHING EXERCISES - Meniscus Tear, Non-operative, Phase I These are some of the initial exercises with which you may start your rehabilitation program, until you see your caregiver again or until your symptoms are resolved. Remember:   These initial exercises  are intended to be gentle. They will help you restore motion without increasing any swelling.  Completing these exercises allows less painful movement and prepares you for the more aggressive strengthening exercises in Phase II.  An effective stretch should be held for at least 30 seconds.  A stretch should never be painful. You should only feel a gentle lengthening or release in the stretched tissue. RANGE OF MOTION - Knee Flexion, Active  Lie on your back with both knees straight. (If this causes back discomfort, bend your healthy knee, placing your foot flat on the floor.)  Slowly slide your heel back toward your buttocks until you feel a gentle stretch in the front of your knee or thigh.  Hold for __________ seconds. Slowly slide your heel back to the starting position. Repeat __________ times. Complete this exercise __________ times per day.  RANGE OF MOTION - Knee Flexion and Extension, Active-Assisted  Sit on the edge of a table or chair with your thighs firmly supported. It may be helpful to place a folded towel under the end of your right / left thigh.  Flexion (bending): Place the ankle of your healthy leg on top of the other ankle. Use your healthy leg to gently bend your right / left knee until you feel a mild tension across the top of your knee.  Hold for __________ seconds.  Extension (straightening): Switch your ankles so your right / left leg  is on top. Use your healthy leg to straighten your right / left knee until you feel a mild tension on the backside of your knee.  Hold for __________ seconds. Repeat __________ times. Complete __________ times per day. STRETCH - Knee Flexion, Supine  Lie on the floor with your right / left heel and foot lightly touching the wall. (Place both feet on the wall if you do not use a door frame.)  Without using any effort, allow gravity to slide your foot down the wall slowly until you feel a gentle stretch in the front of your right /  left knee.  Hold this stretch for __________ seconds. Then return the leg to the starting position, using your healthy leg for help, if needed. Repeat __________ times. Complete this stretch __________ times per day.  STRETCH - Knee Extension Sitting  Sit with your right / left leg/heel propped on another chair, coffee table, or foot stool.  Allow your leg muscles to relax, letting gravity straighten out your knee.*  You should feel a stretch behind your right / left knee. Hold this position for __________ seconds. Repeat __________ times. Complete this stretch __________ times per day.  *Your physician, physical therapist or athletic trainer may instruct you place a __________ weight on your thigh, just above your kneecap, to deepen the stretch.  STRENGTHENING EXERCISES - Meniscus Tear, Non-operative, Phase I These exercises may help you when beginning to rehabilitate your injury. They may resolve your symptoms with or without further involvement from your physician, physical therapist or athletic trainer. While completing these exercises, remember:   Muscles can gain both the endurance and the strength needed for everyday activities through controlled exercises.  Complete these exercises as instructed by your physician, physical therapist or athletic trainer. Progress the resistance and repetitions only as guided. STRENGTH - Quadriceps, Isometrics  Lie on your back with your right / left leg extended and your opposite knee bent.  Gradually tense the muscles in the front of your right / left thigh. You should see either your knee cap slide up toward your hip or increased dimpling just above the knee. This motion will push the back of the knee down toward the floor, mat, or bed on which you are lying.  Hold the muscle as tight as you can, without increasing your pain, for __________ seconds.  Relax the muscles slowly and completely between each repetition. Repeat __________ times.  Complete this exercise __________ times per day.  STRENGTH - Quadriceps, Short Arcs   Lie on your back. Place a __________ inch towel roll under your right / left knee, so that the knee bends slightly.  Raise only your lower leg by tightening the muscles in the front of your thigh. Do not allow your thigh to rise.  Hold this position for __________ seconds. Repeat __________ times. Complete this exercise __________ times per day.  OPTIONAL ANKLE WEIGHTS: Begin with ____________________, but DO NOT exceed ____________________. Increase in 1 pound/0.5 kilogram increments. STRENGTH - Quadriceps, Straight Leg Raises  Quality counts! Watch for signs that the quadriceps muscle is working, to be sure you are strengthening the correct muscles and not "cheating" by substituting with healthier muscles.  Lay on your back with your right / left leg extended and your opposite knee bent.  Tense the muscles in the front of your right / left thigh. You should see either your knee cap slide up or increased dimpling just above the knee. Your thigh may even shake a bit.  Tighten these muscles even more and raise your leg 4 to 6 inches off the floor. Hold for __________ seconds.  Keeping these muscles tense, lower your leg.  Relax the muscles slowly and completely in between each repetition. Repeat __________ times. Complete this exercise __________ times per day.  STRENGTH - Hamstring, Curls   Lay on your stomach with your legs extended. (If you lay on a bed, your feet may hang over the edge.)  Tighten the muscles in the back of your thigh to bend your right / left knee up to 90 degrees. Keep your hips flat on the bed.  Hold this position for __________ seconds.  Slowly lower your leg back to the starting position. Repeat __________ times. Complete this exercise __________ times per day.  STRENGTH - Quadriceps, Squats  Stand in a door frame so that your feet and knees are in line with the  frame.  Use your hands for balance, not support, on the frame.  Slowly lower your weight, bending at the hips and knees. Keep your lower legs upright so that they are parallel with the door frame. Squat only within the range that does not increase your knee pain. Never let your hips drop below your knees.  Slowly return upright, pushing with your legs, not pulling with your hands. Repeat __________ times. Complete this exercise __________ times per day.  STRENGTH - Quad/VMO, Isometric   Sit in a chair with your right / left knee slightly bent. With your fingertips, feel the VMO muscle just above the inside of your knee. The VMO is important in controlling the position of your kneecap.  Keeping your fingertips on this muscle. Without actually moving your leg, attempt to drive your knee down as if straightening your leg. You should feel your VMO tense. If you have a difficult time, you may wish to try the same exercise on your healthy knee first.  Tense this muscle as hard as you can without increasing any knee pain.  Hold for __________ seconds. Relax the muscles slowly and completely in between each repetition. Repeat __________ times. Complete exercise __________ times per day.    This information is not intended to replace advice given to you by your health care provider. Make sure you discuss any questions you have with your health care provider.   Document Released: 07/01/1998 Document Revised: 11/01/2014 Document Reviewed: 09/29/2008 Elsevier Interactive Patient Education [employer]

## 2015-12-04 ENCOUNTER — Ambulatory Visit (INDEPENDENT_AMBULATORY_CARE_PROVIDER_SITE_OTHER): Payer: BLUE CROSS/BLUE SHIELD | Admitting: Family Medicine

## 2015-12-04 VITALS — BP 102/66 | HR 77 | Temp 98.2°F | Resp 16 | Ht 73.0 in | Wt 166.0 lb

## 2015-12-04 DIAGNOSIS — M25461 Effusion, right knee: Secondary | ICD-10-CM | POA: Diagnosis not present

## 2015-12-04 DIAGNOSIS — M25561 Pain in right knee: Secondary | ICD-10-CM | POA: Diagnosis not present

## 2015-12-04 DIAGNOSIS — M25361 Other instability, right knee: Secondary | ICD-10-CM

## 2015-12-04 NOTE — Patient Instructions (Signed)
     IF you received an x-ray today, you will receive an invoice from [facility]. Please contact [facility] at [phone] with questions or concerns regarding your invoice.   IF you received labwork today, you will receive an invoice from [facility]. Please contact [facility] at [phone] with questions or concerns regarding your invoice.   Our billing staff will not be able to assist you with questions regarding bills from these companies.  You will be contacted with the lab results as soon as they are available. The fastest way to get your results is to activate your My Chart account. Instructions are located on the last page of this paperwork. If you have not heard from us regarding the results in 2 weeks, please contact this office.      I will order an MRI to determine ligament or meniscus tear in her knee. Okay to continue hinged knee brace for now, Advil or Aleve over-the-counter as needed. Return if any worsening prior to MRI. Try to avoid squatting or twisting on that knee as much as possible for now.

## 2015-12-04 NOTE — Progress Notes (Signed)
Subjective:  By signing my name below, I, [name], attest that this documentation has been prepared under the direction and in the presence of Meredith StaggersJeffrey Anahlia Iseminger, MD.  Electronically Signed: [name], ED Scribe. 12/04/2015. 3:46 PM.   [name], male    DOB: [birth year], 30 y.o.   MRN: [id number]  HPI Chief Complaint  Patient presents with  . Follow-up    knee pain/ pt states that the pain is not getting better    HPI Comments: Zachary Arnold is a 30 y.o. male who presents to the [facility] follow up of right knee pain.  Seen 5/22 after right knee pain that had been recurrent but felt a pop 2 and 3 days prior. Also noticed more knee swelling. He was placed in a hinged knee brace and OTC NSAID but with his slight instability symptoms poss meniscus injury so considering MRI.   Pt returns today with worsening knee pain to lateral knee. He reports right knee giving way yesterday with twisting motion with the inability to bear weight to right knee. He reports improvement with this today. Pt has been wearing hinged knee brace and has been taking OTC medication as advised.    There are no active problems to display for this patient.  Past Medical History  Diagnosis Date  . TBI (traumatic brain injury) [facility](HCC)    History reviewed. No pertinent past surgical history. No Known Allergies Prior to Admission medications   Not on File   Social History   Social History  . Marital Status: Legally Separated    Spouse Name: N/A  . Number of Children: N/A  . Years of Education: N/A   Occupational History  . Not on file.   Social History Main Topics  . Smoking status: Former Games developermoker  . Smokeless tobacco: Not on file  . Alcohol Use: 0.6 - 1.2 oz/week    1-2 Standard drinks or equivalent per week  . Drug Use: No  . Sexual Activity: Not on file   Other Topics Concern  . Not on file   Social History Narrative   Review of Systems  Musculoskeletal:  Positive for arthralgias. Negative for myalgias, joint swelling and gait problem.  Skin: Negative for color change and wound.  Neurological: Negative for weakness and numbness.    Objective:  Physical Exam  Constitutional: He is oriented to person, place, and time. He appears well-developed and well-nourished. No distress.  HENT:  Head: Normocephalic and atraumatic.  Eyes: Conjunctivae and EOM are normal.  Neck: Neck supple.  Cardiovascular: Normal rate.   Pulmonary/Chest: Effort normal.  Musculoskeletal: Normal range of motion.  Tender along lateral joint line. skin is intact. No erythema. Trace effusion. Full flexion and extension. Small amount of crepitous. Negative varus. Negative valgus. lachman there is increased motion. Possible endpoint. Pain with McMurray's with internal rotation and flexion.   Neurological: He is alert and oriented to person, place, and time.  Skin: Skin is warm and dry.  Psychiatric: He has a normal mood and affect. His behavior is normal.  Nursing note and vitals reviewed.   Filed Vitals:   12/04/15 1322  BP: 102/66  Pulse: 77  Temp: 98.2 F (36.8 C)  TempSrc: Oral  Resp: 16  Height: 6\' 1"  (1.854 m)  Weight: 166 lb (75.297 kg)  SpO2: 98%    Assessment & Plan:  Zachary Arnold is a 30 y.o. male Right knee pain - Plan: MR Knee Right Wo Contrast  Knee  instability, right - Plan: MR Knee Right Wo Contrast  Swelling of right knee joint - Plan: MR Knee Right Wo Contrast  Persistent symptoms in spite of patient is a brace, relative rest, and NSAID. Differential includes meniscal tear or ligamentous tear. Will check MRI, then determine if orthopedic follow-up needed. Requested to be out of work this week due to difficulty with squatting and twisting. Note provided. RTC precautions given  No orders of the defined types were placed in this encounter.   Patient Instructions       IF you received an x-ray today, you will receive an invoice from  [facility]. Please contact [facility] at [phone] with questions or concerns regarding your invoice.   IF you received labwork today, you will receive an invoice from [employer]. Please contact [facility] at [phone] with questions or concerns regarding your invoice.   Our billing staff will not be able to assist you with questions regarding bills from these companies.  You will be contacted with the lab results as soon as they are available. The fastest way to get your results is to activate your My Chart account. Instructions are located on the last page of this paperwork. If you have not heard from [place] regarding the results in 2 weeks, please contact this office.      I will order an MRI to determine ligament or meniscus tear in her knee. Okay to continue hinged knee brace for now, Advil or Aleve over-the-counter as needed. Return if any worsening prior to MRI. Try to avoid squatting or twisting on that knee as much as possible for now.      I personally performed the services described in this documentation, which was scribed in my presence. The recorded information has been reviewed and considered, and addended by me as needed.   Signed,   Meredith Staggers, MD [facility].  12/04/2015 4:03 PM

## 2015-12-11 ENCOUNTER — Inpatient Hospital Stay: Admission: RE | Admit: 2015-12-11 | Payer: Self-pay | Source: Ambulatory Visit

## 2015-12-20 ENCOUNTER — Telehealth: Payer: Self-pay

## 2015-12-20 ENCOUNTER — Ambulatory Visit (INDEPENDENT_AMBULATORY_CARE_PROVIDER_SITE_OTHER): Payer: BLUE CROSS/BLUE SHIELD | Admitting: Family Medicine

## 2015-12-20 VITALS — BP 116/78 | HR 75 | Temp 98.0°F | Resp 17 | Ht 73.0 in | Wt 170.0 lb

## 2015-12-20 DIAGNOSIS — M25561 Pain in right knee: Secondary | ICD-10-CM | POA: Diagnosis not present

## 2015-12-20 NOTE — Patient Instructions (Addendum)
     IF you received an x-ray today, you will receive an invoice from [facility]. Please contact [facility] at [phone] with questions or concerns regarding your invoice.   IF you received labwork today, you will receive an invoice from [facility]. Please contact [facility] at [phone] with questions or concerns regarding your invoice.   Our billing staff will not be able to assist you with questions regarding bills from these companies.  You will be contacted with the lab results as soon as they are available. The fastest way to get your results is to activate your My Chart account. Instructions are located on the last page of this paperwork. If you have not heard from us regarding the results in 2 weeks, please contact this office.     Your knee appears more stable and improved today. Okay to use knee brace, and return to work this upcoming Monday. If you are having increased locking, giving way, or persistent pain, would recommend evaluation with orthopedist or MRI as initially discussed. Let me know if you have any questions in the meantime.

## 2015-12-20 NOTE — Telephone Encounter (Signed)
Patient request for Dr. Neva SeatGreene to complete short term disability form for work. Patient stated he injured his right knee. Patient request us to fax over the form when complete. Patient paid $15 fee. I will place the form inside of the disability/fmla tray. Thanks. Forde RadonAJ

## 2015-12-20 NOTE — Progress Notes (Addendum)
By signing my name below, I, Mesha Guinyard, attest that this documentation has been prepared under the direction and in the presence of Meredith Staggers, MD.  Electronically Signed: Arvilla Market, Medical Scribe. 12/20/2015. 8:41 AM.  Subjective:    [name], male    DOB: 1986/04/21, 30 y.o.   MRN: [id number]  HPI Chief Complaint  Patient presents with  . Follow-up    Right knee   Right nknee no effusion skin intact no warmth FROM   HPI Comments: Zachary Arnold is a 30 y.o. male who presents to the [facility] for a follow-up for right knee pain. Initially seen May 22nd. Initial treatment hinge knee brace, and OTC NSAID. Last visit was June 5th, still some instability symptoms, MRI ordered  Pt is still wearing the brace. Pt has not had any problems when wearing the brace. Pain occurs when pt tries to squat too far it hurts. Pt states his knee hasn't locked or gave away since the last visit. Pt has stayed off of his knee. Pt has a friend who is a licensed message therapist to work on his knee and found relief to his symptoms. Pt has also been doing light yoga and have found relief to his symptoms. Pt canceled MRI because it felt better and it cost too much. Pt would like clearance for work so he could go back the following Monday (in 5 days). Pt is a Armed forces operational officer for [employer]. He never has to run or cut. Pt doesn't have to do heavy lifting. Pt hasn't been back to work-has ppwk for short term disability. Pt has been out of work since May 23rd.  There are no active problems to display for this patient.  Past Medical History  Diagnosis Date  . TBI (traumatic brain injury) (HCC)    No past surgical history on file. No Known Allergies Prior to Admission medications   Not on File   Social History   Social History  . Marital Status: Legally Separated    Spouse Name: N/A  . Number of Children: N/A  . Years of Education: N/A   Occupational  History  . Not on file.   Social History Main Topics  . Smoking status: Former Games developer  . Smokeless tobacco: Not on file  . Alcohol Use: 0.6 - 1.2 oz/week    1-2 Standard drinks or equivalent per week  . Drug Use: No  . Sexual Activity: Not on file   Other Topics Concern  . Not on file   Social History Narrative   Review of Systems   Objective:  BP 116/78 mmHg  Pulse 75  Temp(Src) 98 F (36.7 C) (Oral)  Resp 17  Ht  (1.854 m)  Wt 170 lb (77.111 kg)  BMI 22.43 kg/m2  SpO2 99%  Physical Exam  Constitutional: He is oriented to person, place, and time. He appears well-developed and well-nourished. No distress.  HENT:  Head: Normocephalic and atraumatic.  Eyes: Conjunctivae are normal.  Neck: Neck supple.  Cardiovascular: Normal rate.   Pulmonary/Chest: Effort normal.  Musculoskeletal:  Right knee: No effusion, skin intact, no warmth, FROM Negative varus and valgus Negative McMurray Negative Lachman- not quite as stiff of an end point as on the left Able to squat and duck walk without difficulty  Neurological: He is alert and oriented to person, place, and time.  Skin: Skin is warm and dry.  Psychiatric: He has a normal mood and affect. His behavior is  normal.  Nursing note and vitals reviewed.   Assessment & Plan:   Zachary CornerJoshua Camposano is a 30 y.o. male Right knee pain Right knee pain, with some initial instability symptoms. Much improved with yoga, massage, and no instability since our visit on the fifth. MRI was postponed due to his improvement. Would like to return to work full duty.   -Overall reassuring exam, can try return to work this upcoming Monday with brace as needed, but if recurrent instability symptoms or persistent pain, ortho eval or MRI was recommended.  No orders of the defined types were placed in this encounter.   Patient Instructions       IF you received an x-ray today, you will receive an invoice from [facility]. Please contact  [facility] at [phone] with questions or concerns regarding your invoice.   IF you received labwork today, you will receive an invoice from [facility]. Please contact [facility] at [phone] with questions or concerns regarding your invoice.   Our billing staff will not be able to assist you with questions regarding bills from these companies.  You will be contacted with the lab results as soon as they are available. The fastest way to get your results is to activate your My Chart account. Instructions are located on the last page of this paperwork. If you have not heard from us regarding the results in 2 weeks, please contact this office.     Your knee appears more stable and improved today. Okay to use knee brace, and return to work this upcoming Monday. If you are having increased locking, giving way, or persistent pain, would recommend evaluation with orthopedist or MRI as initially discussed. Let me know if you have any questions in the meantime.    I personally performed the services described in this documentation, which was scribed in my presence. The recorded information has been reviewed and considered, and addended by me as needed.   Signed,   Meredith StaggersJeffrey Feliza Diven, MD [facility].  12/20/2015 9:02 AM

## 2015-12-25 NOTE — Telephone Encounter (Signed)
Patient needs disability form completed by Dr Neva SeatGreene, I have completed what I could from the OV notes and highlighted the areas that need to be updated, I will place them in your box on 12/25/15 if you could please return them the to FMLA/Disability box at the [address] checkout desk within 5-7 business days. Thank you!

## 2015-12-29 NOTE — Telephone Encounter (Signed)
Form done. In FMLA chartbox.

## 2016-01-01 NOTE — Telephone Encounter (Signed)
Paperwork scanned and faxed to company on 01/01/16

## 2016-01-08 DIAGNOSIS — Z0271 Encounter for disability determination: Secondary | ICD-10-CM

## 2017-08-29 IMAGING — CR DG KNEE COMPLETE 4+V*R*
4 series · 4 of 4 positions shown · non-contrast
Comparison: None.

CLINICAL DATA: Recurrent knee injury.  Swelling.

EXAM:
RIGHT KNEE - COMPLETE 4+ VIEW

[AP]
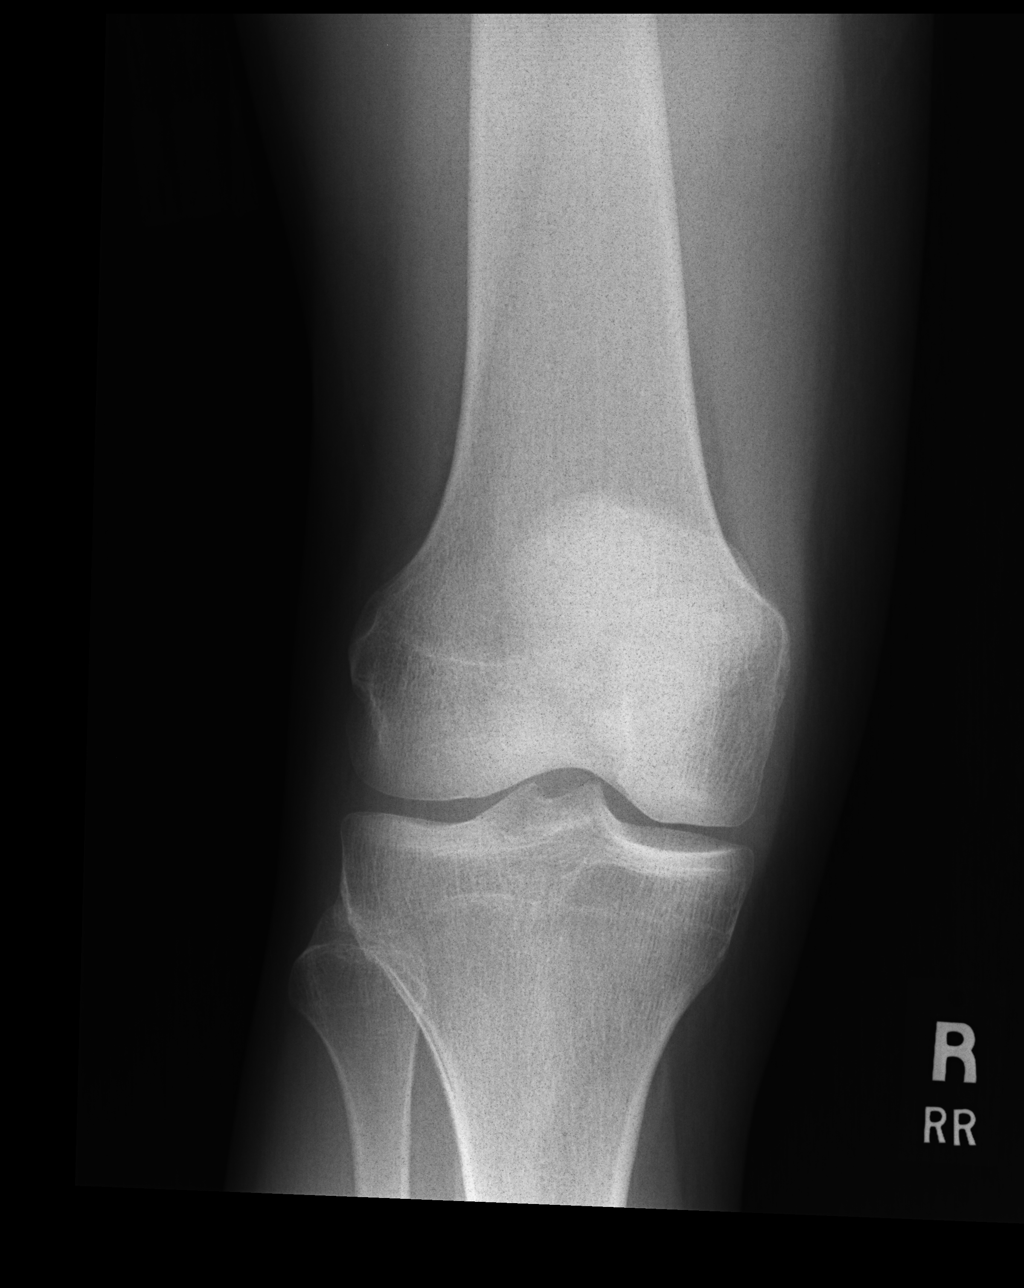

[ap ext rot]
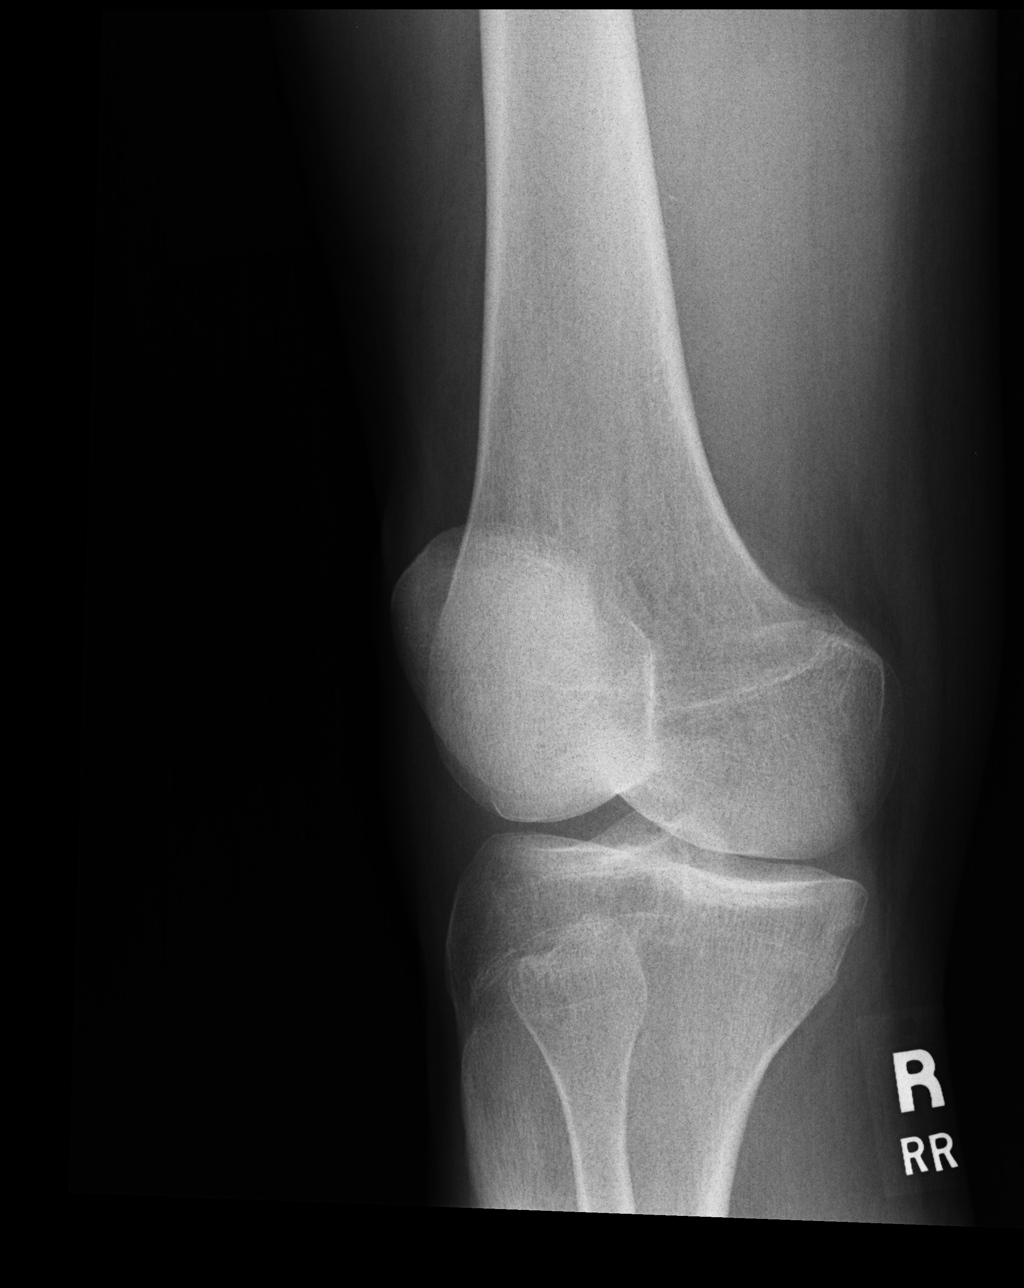

[ap int rot]
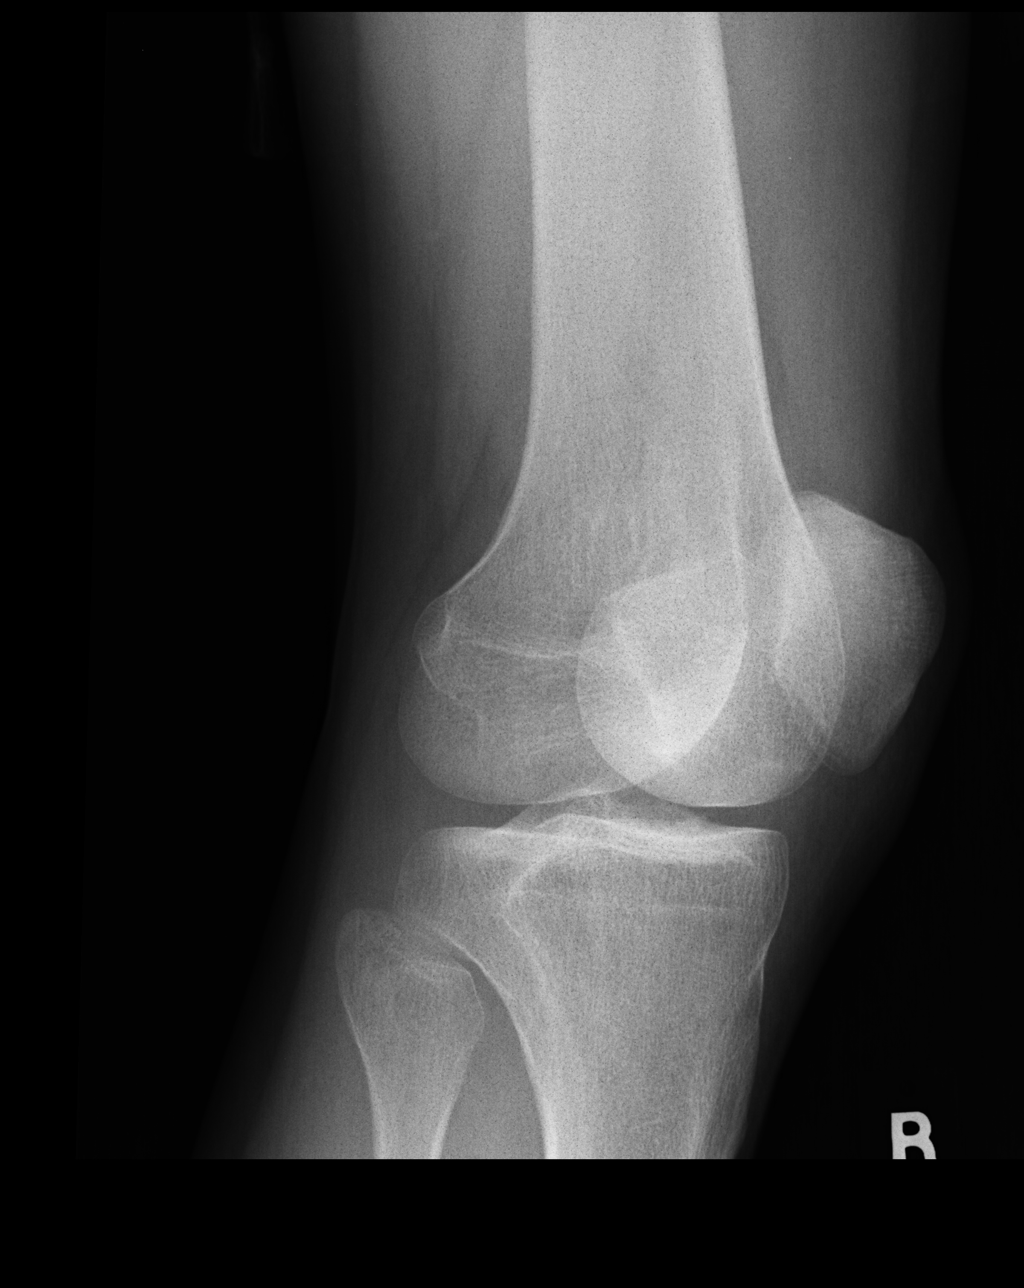

[lateral]
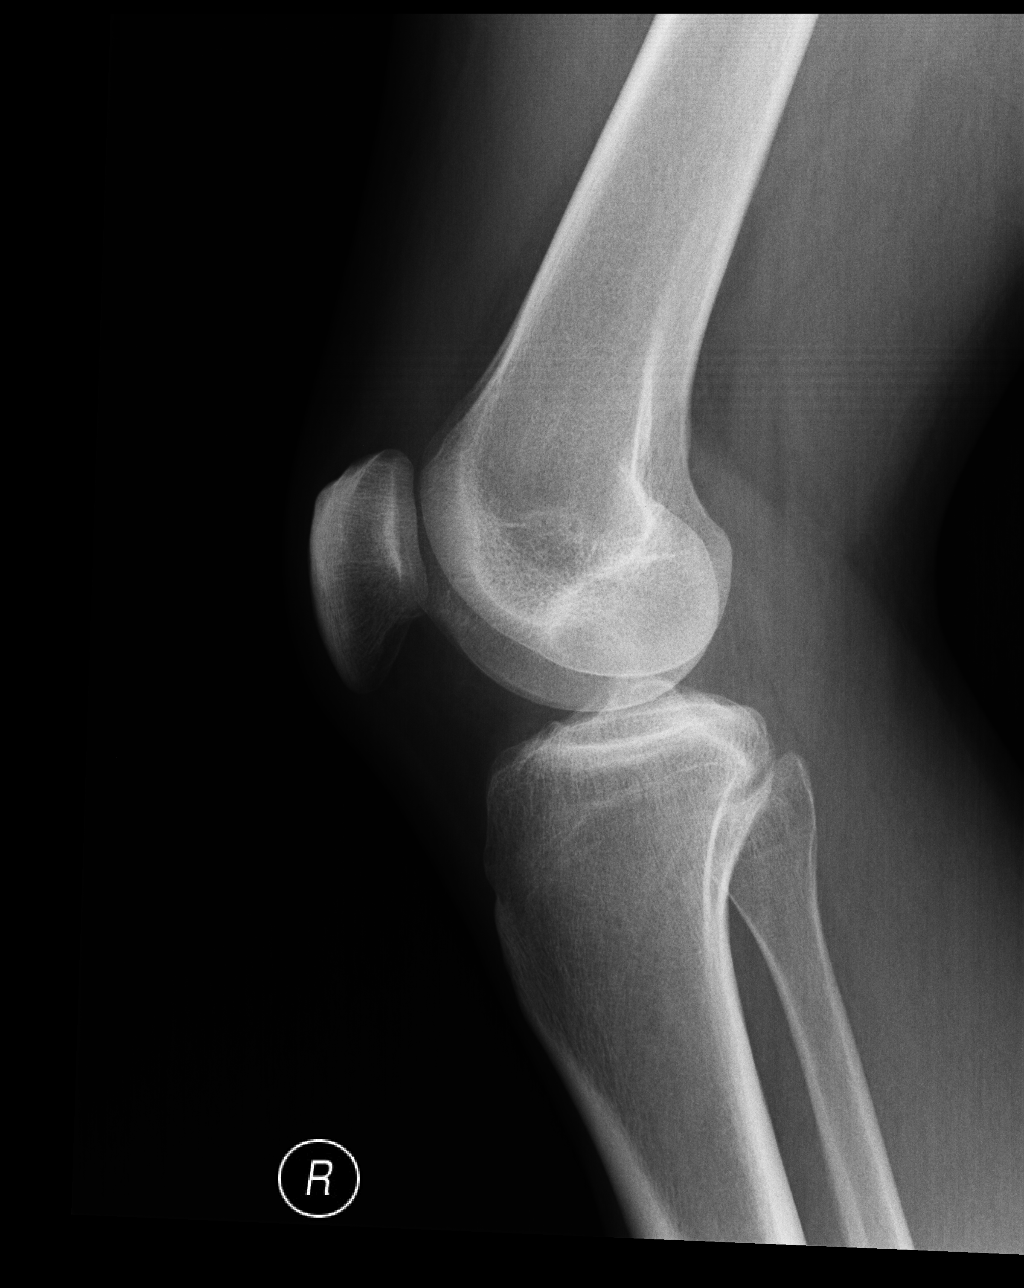

[4 of 4 positions shown; findings below may reference images not displayed]

FINDINGS: No acute bony or joint abnormality identified. No evidence of
fracture or dislocation .
IMPRESSION: No acute or focal abnormality.

## 2019-01-08 ENCOUNTER — Other Ambulatory Visit: Payer: Self-pay

## 2019-01-08 DIAGNOSIS — Z20822 Contact with and (suspected) exposure to covid-19: Secondary | ICD-10-CM

## 2019-01-13 LAB — NOVEL CORONAVIRUS, NAA: SARS-CoV-2, NAA: NOT DETECTED
